# Patient Record
Sex: Male | Born: 1995 | Race: Black or African American | Hispanic: No | Marital: Single | Smoking: Never smoker
Health system: Southern US, Community
[De-identification: ages and names within clinical notes are randomized; demographics above are authoritative.]

## PROBLEM LIST (undated history)

## (undated) DIAGNOSIS — M549 Dorsalgia, unspecified: Secondary | ICD-10-CM

## (undated) DIAGNOSIS — F4310 Post-traumatic stress disorder, unspecified: Secondary | ICD-10-CM

## (undated) DIAGNOSIS — F32A Depression, unspecified: Secondary | ICD-10-CM

## (undated) DIAGNOSIS — G43909 Migraine, unspecified, not intractable, without status migrainosus: Secondary | ICD-10-CM

## (undated) DIAGNOSIS — F419 Anxiety disorder, unspecified: Secondary | ICD-10-CM

## (undated) HISTORY — DX: Dorsalgia, unspecified: M54.9

## (undated) HISTORY — DX: Anxiety disorder, unspecified: F41.9

## (undated) HISTORY — DX: Depression, unspecified: F32.A

## (undated) HISTORY — DX: Post-traumatic stress disorder, unspecified: F43.10

## (undated) HISTORY — DX: Migraine, unspecified, not intractable, without status migrainosus: G43.909

---
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
## 2022-10-22 ENCOUNTER — Ambulatory Visit: Payer: No Typology Code available for payment source | Admitting: Allergy

## 2022-10-22 ENCOUNTER — Encounter: Payer: Self-pay | Admitting: Allergy

## 2022-10-22 VITALS — BP 120/70 | HR 70 | Resp 16 | Ht 70.5 in | Wt 175.6 lb

## 2022-10-22 DIAGNOSIS — L2089 Other atopic dermatitis: Secondary | ICD-10-CM

## 2022-10-22 DIAGNOSIS — T783XXA Angioneurotic edema, initial encounter: Secondary | ICD-10-CM | POA: Diagnosis not present

## 2022-10-22 DIAGNOSIS — R0602 Shortness of breath: Secondary | ICD-10-CM | POA: Diagnosis not present

## 2022-10-22 DIAGNOSIS — H1013 Acute atopic conjunctivitis, bilateral: Secondary | ICD-10-CM

## 2022-10-22 DIAGNOSIS — J3089 Other allergic rhinitis: Secondary | ICD-10-CM | POA: Diagnosis not present

## 2022-10-22 DIAGNOSIS — T783XXD Angioneurotic edema, subsequent encounter: Secondary | ICD-10-CM

## 2022-10-22 MED ORDER — TRIAMCINOLONE ACETONIDE 0.1 % EX OINT: TOPICAL_OINTMENT | CUTANEOUS | 1 refills | Status: AC

## 2022-10-22 MED ORDER — CETIRIZINE HCL 10 MG PO TABS: ORAL_TABLET | ORAL | 1 refills | Status: AC

## 2022-10-22 MED ORDER — OLOPATADINE HCL 0.1 % OP SOLN: OPHTHALMIC | 1 refills | Status: AC

## 2022-10-22 MED ORDER — AZELASTINE HCL 0.1 % NA SOLN: NASAL | 1 refills | Status: AC

## 2022-10-22 NOTE — Patient Instructions (Signed)
Facial swelling (angioedema) Environmental allergy (rhinoconjunctivitis)  - Testing today showed: grasses, ragweed, weeds, trees, indoor molds, outdoor molds, dust mites, and cat. - Copy of test results provided.  - Avoidance measures provided. - Start taking: Zyrtec (cetirizine) '10mg'$  tablet once daily as needed for general allergy.  May need to take daily during pollen season.  Astelin (azelastine) 2 sprays per nostril 1-2 times daily as needed for runny nose. Patanol (olopatadine) one drop per eye twice daily as needed for itchy/watery/red eyes.  - You can use an extra dose of the antihistamine, if needed, for breakthrough symptoms.  - Consider nasal saline rinses 1-2 times daily to remove allergens from the nasal cavities as well as help with mucous clearance (this is especially helpful to do before the nasal sprays are given) - Consider allergy shots as a means of long-term control if medication management is not effective enough.  - Allergy shots "re-train" and "reset" the immune system to ignore environmental allergens and decrease the resulting immune response to those allergens (sneezing, itchy watery eyes, runny nose, nasal congestion, etc).    - Allergy shots improve symptoms in 75-85% of patients.   Eczema - Bathe and soak for 5-10 minutes in warm water once a day. Pat dry.  Immediately apply the below cream prescribed to flared areas (red, irritated, dry, itchy, patchy, scaly, flaky) only. Wait several minutes and then apply your moisturizer all over.    To affected areas on the body (below the face and neck), apply: Triamcinolone 0.1 % ointment twice a day as needed.  With ointments be careful to avoid the armpits and groin area.  Shortness of breath - Have access to albuterol inhaler 2 puffs every 4-6 hours as needed for cough/wheeze/shortness of breath/chest tightness.  May use 15-20 minutes prior to activity.   Monitor frequency of use.   - Lung function testing today is  normal  Follow-up in 3-4 months or sooner if needed

## 2022-10-22 NOTE — Progress Notes (Signed)
New Patient Note  RE: Shane Leon MRN: [id number] DOB: [birth year] Date of Office Visit: 10/22/2022   Primary care provider: Dalbert Mayotte, MD  Chief Complaint: swelling   History of present illness: Shane Leon is a 27 y.o. male presenting today for evaluation of angioedema.   He has had facial swelling this year.  He states first time he had swelling of his eyes in mid-January.  He states his cat started sleeping in room/bed.  He did stop the cat from sleeping in the room and states when he had the cat of the room the swelling did not return. He states he did go to UC/ED for the swelling and did receive an ointment for the eyes.  He also has used a warm compress.   He states with cat exposure he could also have runny nose and would take a antihistamine.  He has sisters that have had facial swelling and one of them had swelling after eating shrimp and had positive to shrimp, cat and dogs on allergy testing.   He states one time he had hives after his mother had used some detergent or cleaner on the floor.  This was last summer.  Does not recall any other hive episodes.  He does have an albuterol inhaler for shortness of breath.  He has no history of diagnosed asthma.  He states he can have the SOB with exercise.  The albuterol does help relieve shortness of breath.   He has history of eczema that can flare in the arm creases and can be random.  At this time he just moisturizes after bathing.   No history of food allergy.  He has not changed his diet related to the swelling.   Review of systems in the past 4 weeks: Review of Systems  Constitutional: Negative.   HENT: Negative.    Eyes: Negative.   Respiratory: Negative.    Cardiovascular: Negative.   Musculoskeletal: Negative.   Skin: Negative.   Allergic/Immunologic: Negative.   Neurological: Negative.     All other systems negative unless noted above in HPI  Past medical history: Past Medical History:   Diagnosis Date   Anxiety    Depressed    Dorsalgia    Migraines    PTSD (post-traumatic stress disorder)     Past surgical history: History reviewed. No pertinent surgical history.  Family history:  Family History  Problem Relation Age of Onset   Angioedema Sister    Angioedema Sister     Social history: Lives in a townhome with carpeting with electric heating with central, window, fan cooling.  Cat in the home.  There is no concern for water damage, mildew or roaches in the home.  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  He denies a smoking history.   Medication List: Current Outpatient Medications  Medication Sig Dispense Refill   acetaminophen (TYLENOL) 325 MG tablet TAKE THREE TABLETS BY MOUTH 3 TIMES A DAY AS NEEDED FOR BACKACHE EACH TABLET CONTAINS '325MG'$  ACETAMINOPHEN (TYLENOL,APAP). MAXIMUM DAILY RECOMMENDED DOSE IS '3000MG'$      AIMOVIG 140 MG/ML SOAJ INJECT 1 PEN UNDER SKIN EVERY MONTH     albuterol (VENTOLIN HFA) 108 (90 Base) MCG/ACT inhaler INHALE 1 PUFF BY ORAL INHALATION FOUR TIMES A DAY AS NEEDED FOR EXERCISE-INDUCED BRONCHOSPASM BE SURE TO WASH MOUTHPIECE WITH WARM WATER ONCE A WEEK     azelastine (ASTELIN) 0.1 % nasal spray Use two sprays in each nostril once or twice daily 90 mL 1  benzoyl peroxide 5 % gel APPLY SMALL AMOUNT TOPICALLY IN THE MORNING     cetirizine (ZYRTEC) 10 MG tablet Take one tablet once or twice daily 180 tablet 1   diphenhydrAMINE (BENADRYL) 25 mg capsule TAKE 1-2 CAPSULES BY MOUTH AT BEDTIME AS NEEDED     fluticasone (FLONASE) 50 MCG/ACT nasal spray INSTILL 1 SPRAY INTO NOSE EVERY DAY FOR ALLERGIC RHINITIS     hydrocortisone 2.5 % lotion APPLY SMALL AMOUNT TOPICALLY AS DIRECTED ONLY USE FOR FLARE UPS     indomethacin (INDOCIN) 50 MG capsule TAKE ONE CAPSULE BY MOUTH TWO TIMES A DAY WITH FOODTAKE WITH FOOD     olopatadine (PATANOL) 0.1 % ophthalmic solution Use one drop in each eye twice daily if needed. 15 mL 1   oxybutynin (DITROPAN) 5 MG tablet TAKE  ONE TABLET BY MOUTH EVERY 8 HOURS FOR OVERACTIVE BLADDER     propranolol (INDERAL) 20 MG tablet Take 1 tablet by mouth 3 (three) times daily.     rizatriptan (MAXALT-MLT) 10 MG disintegrating tablet TAKE ONE TABLET BY MOUTH AS NEEDEDAT MIGRAINE ONSET. MAY REPEAT DOSE IN 2 HOURS IF NEEDED. DO NOT TAKE MORE THAN30 MG IN 24 HOURS (6 DOSES OF 5 MG TABLET, 3 DOSES OF 10 MG TABLET).     sertraline (ZOLOFT) 100 MG tablet TAKE ONE AND ONE-HALF TABLETS BY MOUTH EVERY MORNING     tretinoin (RETIN-A) 0.025 % gel APPLY SMALL AMOUNT TOPICALLY AT BEDTIME PSEUDOFOLLICULITIS     triamcinolone ointment (KENALOG) 0.1 % Apply to red, itchy areas if needed twice daily 240 g 1   Vitamin D, Ergocalciferol, (DRISDOL) 1.25 MG (50000 UNIT) CAPS capsule Take by mouth.     No current facility-administered medications for this visit.    Known medication allergies: No Known Allergies   Physical examination: Blood pressure 120/70, pulse 70, resp. rate 16, height 5' 10.5" (1.791 m), weight 175 lb 9.6 oz (79.7 kg), SpO2 97 %.  General: Alert, interactive, in no acute distress. HEENT: PERRLA, TMs pearly gray, turbinates non-edematous without discharge, post-pharynx non erythematous. Neck: Supple without lymphadenopathy. Lungs: Clear to auscultation without wheezing, rhonchi or rales. {no increased work of breathing. CV: Normal S1, S2 without murmurs. Abdomen: Nondistended, nontender. Skin: Warm and dry, without lesions or rashes. Extremities:  No clubbing, cyanosis or edema. Neuro:   Grossly intact.  Diagnositics/Labs:  Spirometry: FEV1: 3.87L 97%, FVC: 4.4L 93%, ratio consistent with nonobstructive pattern  Allergy testing:   Airborne Adult Perc - 10/22/22 1459     Time Antigen Placed 1459    Allergen Manufacturer Lavella Hammock    Location Back    Number of Test 59    Panel 1 Select    1. Control-Buffer 50% Glycerol Negative    2. Control-Histamine 1 mg/ml 2+    3. Albumin saline Negative    4. [place] 4+    5.  [place] 4+    6. [place] 4+    7. [place] 4+    8. Meadow Fescue 4+    9. Perennial Rye 4+    10. Sweet Vernal 4+    11. Timothy 4+    12. Cocklebur Negative    13. Burweed Marshelder Negative    14. Ragweed, short Negative    15. Ragweed, Giant Negative    16. Plantain,  English 2+    17. Lamb's Quarters 2+    18. Sheep Sorrell Negative    19. Rough Pigweed Negative    20. Skeet Simmer Elder, Rough Negative  21. Mugwort, Common 2+    22. Ash mix Negative    23. Birch mix 2+    24. Beech American Negative    25. Box, Elder 2+    26. Cedar, red Negative    27. Cottonwood, [place] Negative    28. Elm mix Negative    29. Hickory Negative    30. Maple mix Negative    31. Oak, [place] mix 3+    32. Pecan Pollen 2+    33. Pine mix Negative    34. Sycamore Eastern 2+    35. [place], Black Pollen Negative    36. Alternaria alternata 2+    37. Cladosporium Herbarum Negative    38. Aspergillus mix 2+    39. Penicillium mix Negative    40. Bipolaris sorokiniana (Helminthosporium) Negative    41. Drechslera spicifera (Curvularia) 2+    42. Mucor plumbeus Negative    43. Fusarium moniliforme 2+    44. Aureobasidium pullulans (pullulara) Negative    45. Rhizopus oryzae Negative    46. Botrytis cinera Negative    47. Epicoccum nigrum Negative    48. Phoma betae Negative    49. Candida Albicans Negative    50. Trichophyton mentagrophytes Negative    51. Mite, D Farinae  5,000 AU/ml Negative    52. Mite, D Pteronyssinus  5,000 AU/ml 2+    53. Cat Hair 10,000 BAU/ml 2+    54.  Dog Epithelia Negative    55. Mixed Feathers Negative    56. Horse Epithelia Negative    57. Cockroach, German Negative    58. Mouse Negative    59. Tobacco Leaf Negative             Food Perc - 10/22/22 1459       Test Information   Time Antigen Placed 1459    Allergen Manufacturer Lavella Hammock    Location Back    Number of allergen test [place]   1. Peanut Negative    2.  Soybean food Negative    3. Wheat, whole Negative    4. Sesame Negative    5. Milk, cow Negative    6. Egg White, chicken Negative    7. Casein Negative    8. Shellfish mix Negative    9. Fish mix Negative    10. Cashew Negative             Allergy testing results were read and interpreted by provider, documented by clinical staff.   Assessment and plan:   Facial swelling (angioedema) Environmental allergy (rhinoconjunctivitis) - Testing today showed: grasses, ragweed, weeds, trees, indoor molds, outdoor molds, dust mites, and cat. - Copy of test results provided.  - Avoidance measures provided. - Start taking: Zyrtec (cetirizine) '10mg'$  tablet once daily as needed for general allergy.  May need to take daily during pollen season.  Astelin (azelastine) 2 sprays per nostril 1-2 times daily as needed for runny nose. Patanol (olopatadine) one drop per eye twice daily as needed for itchy/watery/red eyes.  - You can use an extra dose of the antihistamine, if needed, for breakthrough symptoms.  - Consider nasal saline rinses 1-2 times daily to remove allergens from the nasal cavities as well as help with mucous clearance (this is especially helpful to do before the nasal sprays are given) - Consider allergy shots as a means of long-term control if medication management is not effective enough.  - Allergy shots "  re-train" and "reset" the immune system to ignore environmental allergens and decrease the resulting immune response to those allergens (sneezing, itchy watery eyes, runny nose, nasal congestion, etc).    - Allergy shots improve symptoms in 75-85% of patients.   Eczema - Bathe and soak for 5-10 minutes in warm water once a day. Pat dry.  Immediately apply the below cream prescribed to flared areas (red, irritated, dry, itchy, patchy, scaly, flaky) only. Wait several minutes and then apply your moisturizer all over.    To affected areas on the body (below the face and neck),  apply: Triamcinolone 0.1 % ointment twice a day as needed.  With ointments be careful to avoid the armpits and groin area.  Shortness of breath - Have access to albuterol inhaler 2 puffs every 4-6 hours as needed for cough/wheeze/shortness of breath/chest tightness.  May use 15-20 minutes prior to activity.   Monitor frequency of use.   - Lung function testing today is normal  Follow-up in 3-4 months or sooner if needed  I appreciate the opportunity to take part in Izack's care. Please do not hesitate to contact me with questions.  Sincerely,   Prudy Feeler, MD Allergy/Immunology [facility]

## 2023-10-08 ENCOUNTER — Telehealth: Payer: Self-pay

## 2023-10-08 NOTE — Telephone Encounter (Signed)
 New request has been sent to the [facility] for renewal.

## 2023-10-20 ENCOUNTER — Telehealth: Payer: Self-pay | Admitting: Allergy

## 2023-10-20 NOTE — Telephone Encounter (Signed)
 Called and spoke with pt and advised pt to call [facility] and informed pt a new [facility] authorization request has been sent in.

## 2023-11-04 ENCOUNTER — Ambulatory Visit: Payer: No Typology Code available for payment source | Admitting: Allergy
